# Patient Record
Sex: Female | Born: 1980 | Race: Black or African American | Hispanic: No | Marital: Married | Smoking: Never smoker
Health system: Southern US, Community
[De-identification: ages and names within clinical notes are randomized; demographics above are authoritative.]

## PROBLEM LIST (undated history)

## (undated) DIAGNOSIS — T7840XA Allergy, unspecified, initial encounter: Secondary | ICD-10-CM

## (undated) DIAGNOSIS — L709 Acne, unspecified: Secondary | ICD-10-CM

## (undated) DIAGNOSIS — J45909 Unspecified asthma, uncomplicated: Secondary | ICD-10-CM

## (undated) HISTORY — DX: Acne, unspecified: L70.9

## (undated) HISTORY — DX: Unspecified asthma, uncomplicated: J45.909

## (undated) HISTORY — DX: Allergy, unspecified, initial encounter: T78.40XA

---

## 2019-01-30 ENCOUNTER — Other Ambulatory Visit: Payer: Self-pay | Admitting: Otolaryngology

## 2019-01-30 DIAGNOSIS — E041 Nontoxic single thyroid nodule: Secondary | ICD-10-CM

## 2019-01-31 ENCOUNTER — Ambulatory Visit
Admission: RE | Admit: 2019-01-31 | Discharge: 2019-01-31 | Disposition: A | Discharge status: Home / Self Care | Payer: Self-pay | Source: Ambulatory Visit | Attending: Otolaryngology | Admitting: Otolaryngology

## 2019-01-31 DIAGNOSIS — E041 Nontoxic single thyroid nodule: Secondary | ICD-10-CM

## 2019-03-24 DIAGNOSIS — R09A2 Foreign body sensation, throat: Secondary | ICD-10-CM | POA: Insufficient documentation

## 2019-03-24 DIAGNOSIS — K219 Gastro-esophageal reflux disease without esophagitis: Secondary | ICD-10-CM | POA: Insufficient documentation

## 2019-08-25 DIAGNOSIS — J4520 Mild intermittent asthma, uncomplicated: Secondary | ICD-10-CM | POA: Insufficient documentation

## 2020-09-04 IMAGING — US US THYROID
1 series · 13 of 25 positions shown · non-contrast
Comparison: None.

CLINICAL DATA: Palpable abnormality. Thyroid nodule palpated on
physical examination. Difficulty swallowing.

EXAM:
THYROID ULTRASOUND
TECHNIQUE: Ultrasound examination of the thyroid gland and adjacent soft
tissues was performed.

[Series 1: us thyroid · 0.08mm/px · 13 of 45 slices shown]
[im 1/45]
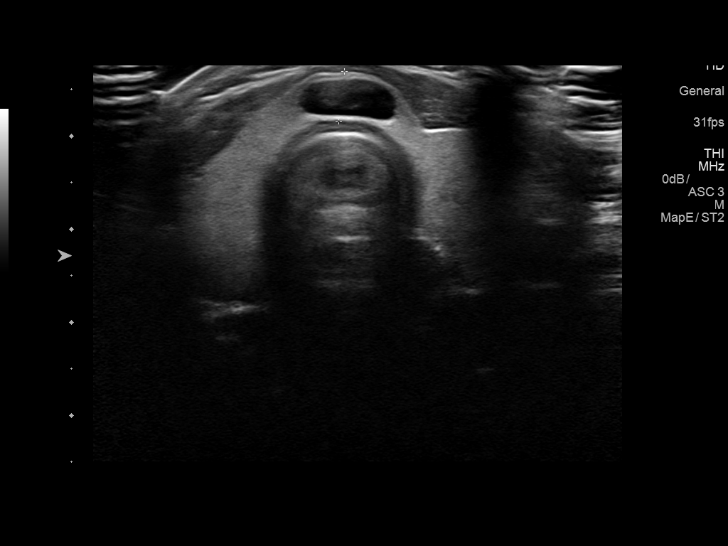
[im 4/45]
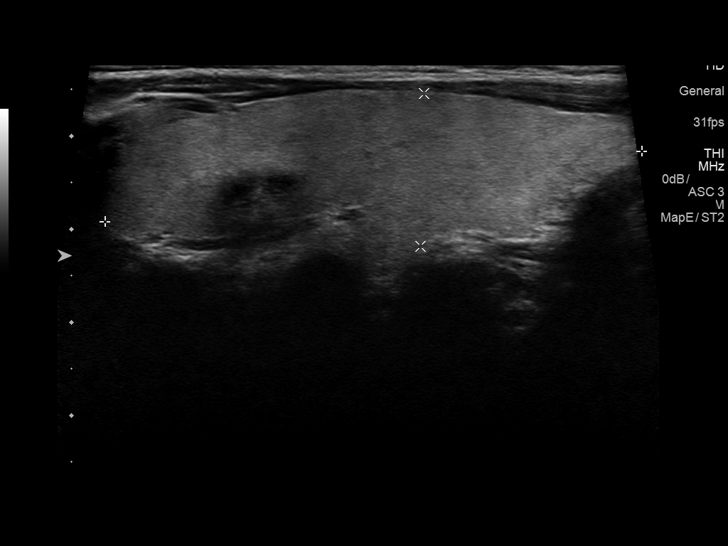
[im 8/45]
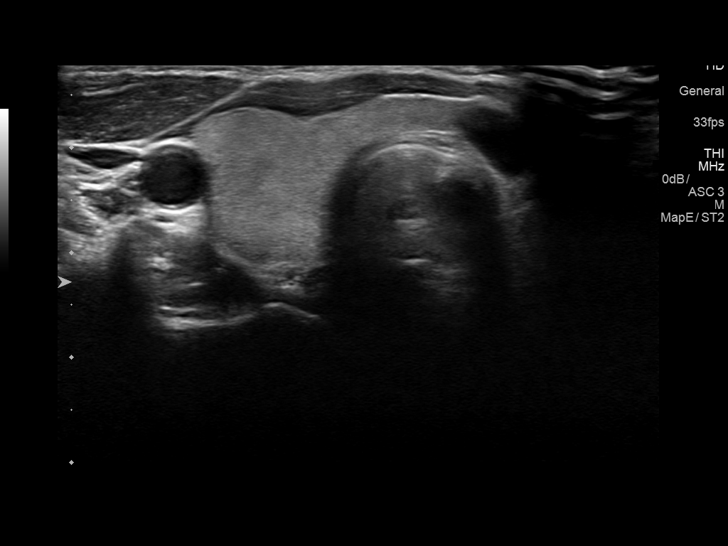
[im 12/45]
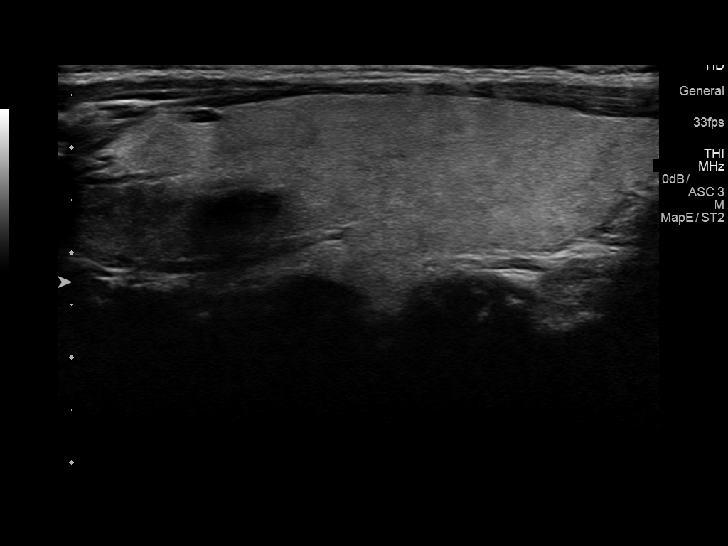
[im 15/45]
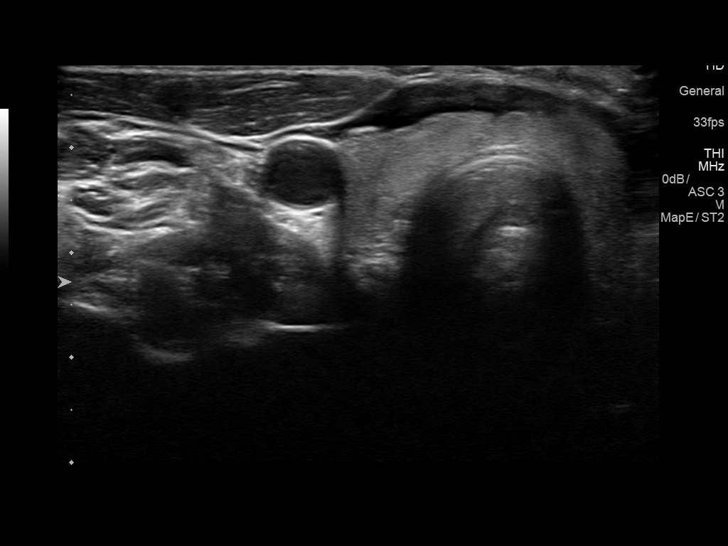
[im 19/45]
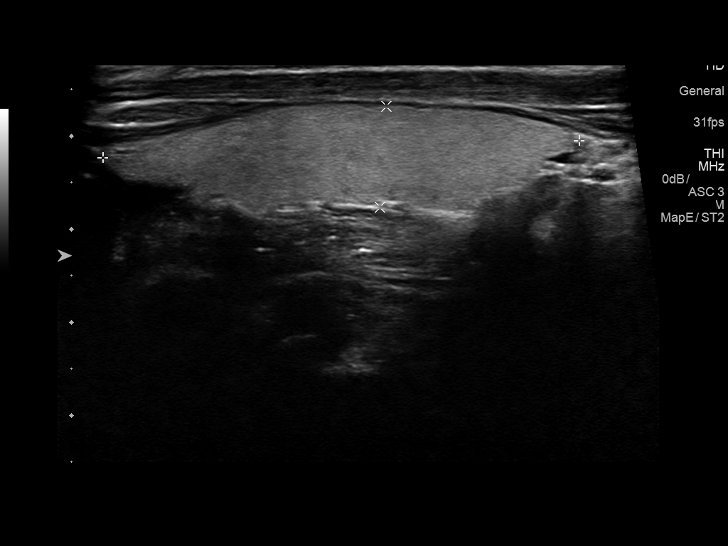
[im 23/45]
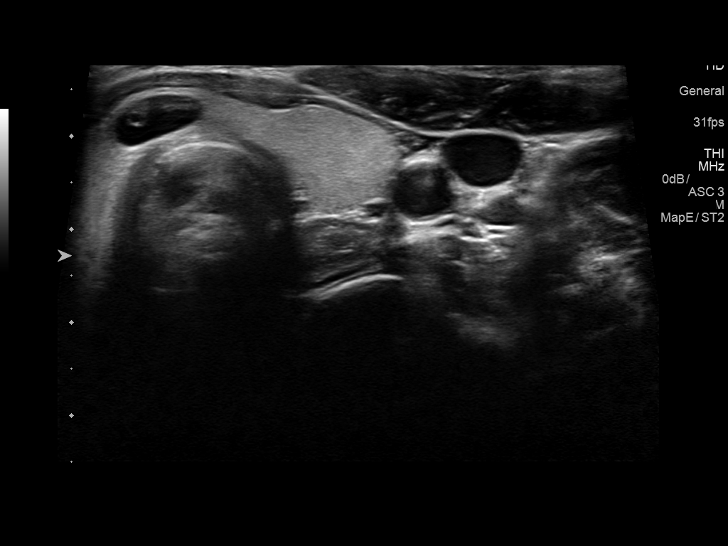
[im 26/45]
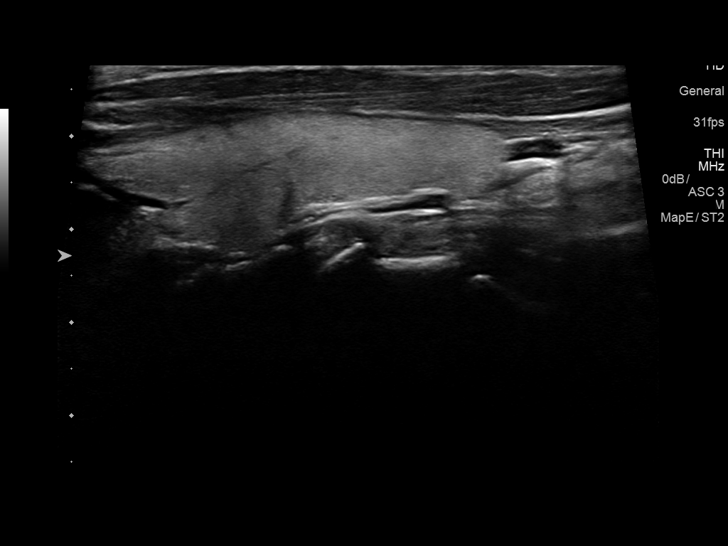
[im 30/45]
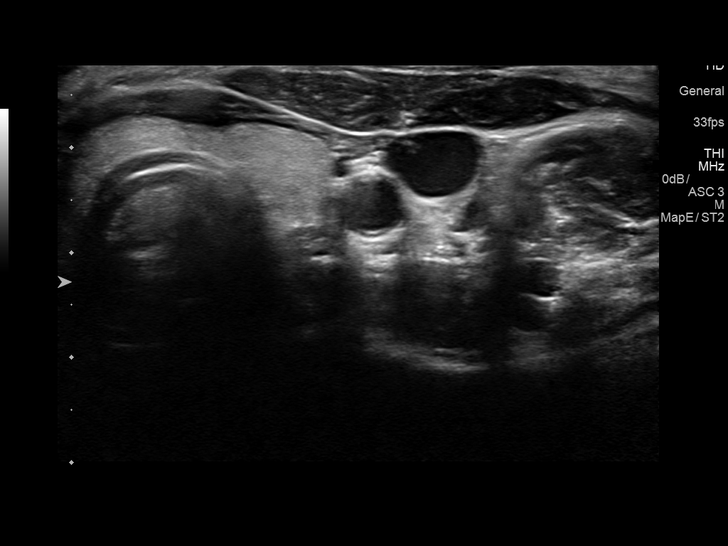
[im 34/45]
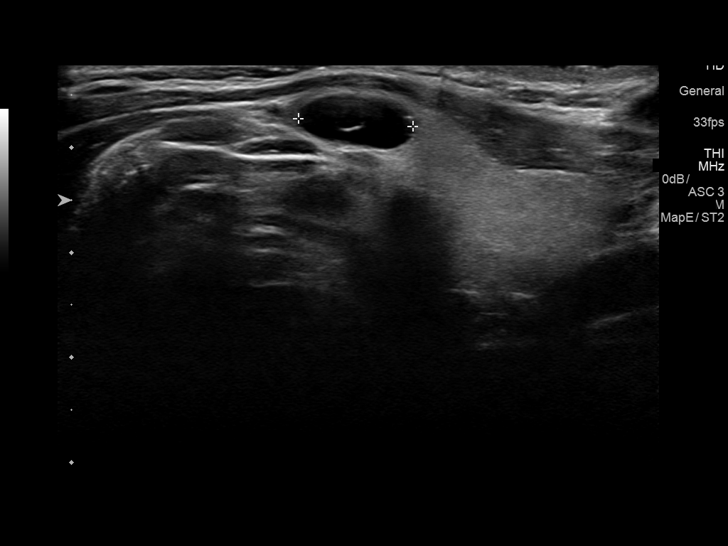
[im 37/45]
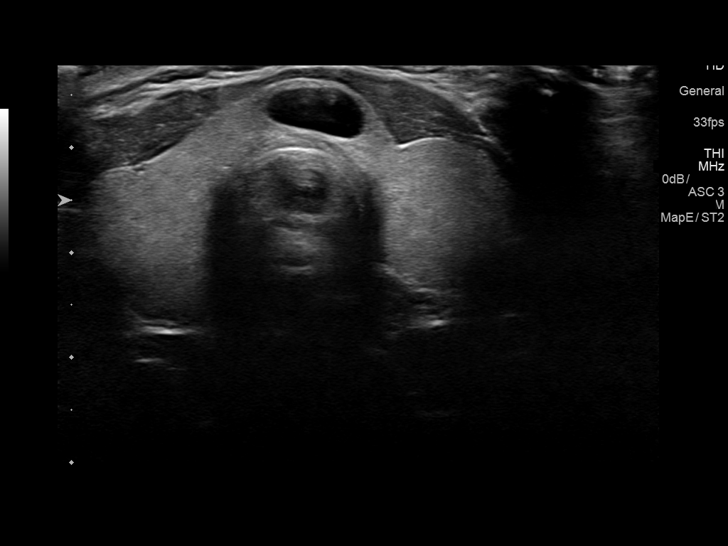
[im 41/45]
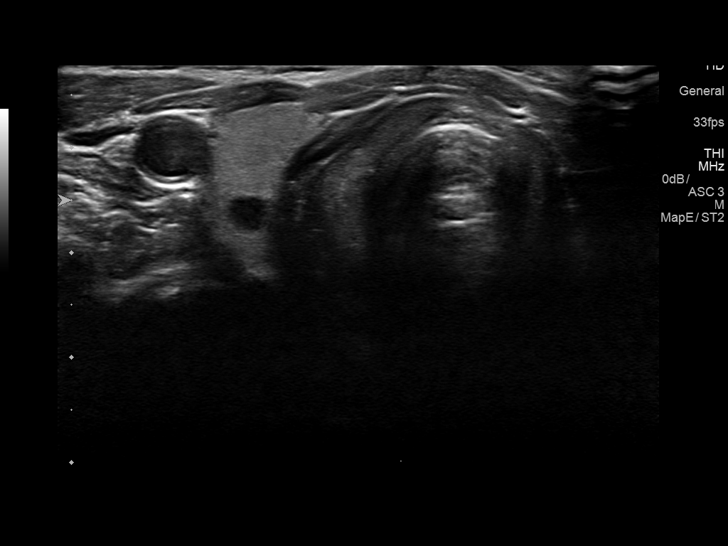
[im 45/45]
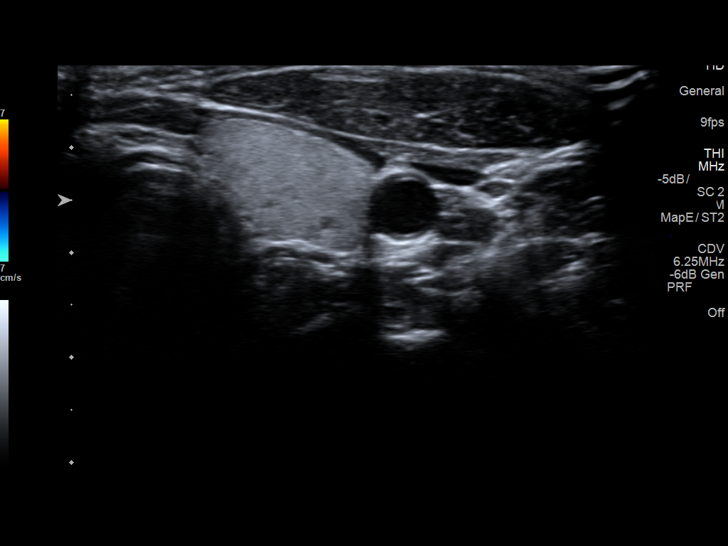

[13 of 25 positions shown; findings below may reference images not displayed]

FINDINGS: Parenchymal Echotexture: Normal

Isthmus: Normal in size measures 0.5 cm in diameter

Right lobe: Normal in size measuring 5.8 x 1.6 x 1.4 cm

Left lobe: Normal in size measuring 5.1 x 1.1 x 1.4 cm

_________________________________________________________

Estimated total number of nodules >/= 1 cm: 1

Number of spongiform nodules >/=  2 cm not described below (TR1): 0

Number of mixed cystic and solid nodules >/= 1.5 cm not described
below (TR2): 0

_________________________________________________________

There is an approximately 1.1 x 0.9 x 0.4 cm anechoic cyst within
the thyroid isthmus which is noted to contain a thin internal
echogenic septation, however does not meet imaging criteria to
recommend percutaneous sampling or continued dedicated follow-up

There is a approximately 0.8 x 0.4 x 0.4 cm anechoic cyst within the
superior/mid aspect the right lobe of the thyroid (labeled 2), which
is noted to contain internal echogenic foci with ring down artifact
compatible with benign colloid. This benign colloid containing cyst
does not meet imaging criteria to recommend percutaneous sampling or
continued dedicated follow-up.
IMPRESSION: 1. Punctate (sub 1.1 cm) thyroid cysts, neither of which meet
imaging criteria to recommend percutaneous sampling or continued
dedicated follow-up.
2. Otherwise, normal thyroid ultrasound.

The above is in keeping with the ACR TI-RADS recommendations - [HOSPITAL] 5659;[DATE].

## 2023-04-12 ENCOUNTER — Ambulatory Visit: Payer: Commercial Managed Care - PPO | Admitting: Dermatology

## 2023-04-12 ENCOUNTER — Encounter: Payer: Self-pay | Admitting: Dermatology

## 2023-04-12 VITALS — BP 97/66

## 2023-04-12 DIAGNOSIS — L700 Acne vulgaris: Secondary | ICD-10-CM | POA: Diagnosis not present

## 2023-04-12 DIAGNOSIS — L821 Other seborrheic keratosis: Secondary | ICD-10-CM | POA: Diagnosis not present

## 2023-04-12 DIAGNOSIS — L810 Postinflammatory hyperpigmentation: Secondary | ICD-10-CM | POA: Diagnosis not present

## 2023-04-12 MED ORDER — HYDROQUINONE 4 % EX CREA: TOPICAL_CREAM | Freq: Every evening | CUTANEOUS | 2 refills | Status: DC

## 2023-04-12 MED ORDER — CLINDAMYCIN PHOSPHATE 1 % EX SWAB: 1.0000 | Freq: Every morning | CUTANEOUS | 2 refills | Status: AC

## 2023-04-12 MED ORDER — TRETINOIN 0.025 % EX CREA: TOPICAL_CREAM | Freq: Every day | CUTANEOUS | 2 refills | Status: DC

## 2023-04-12 NOTE — Patient Instructions (Addendum)
She wants to schedule a cosmetic procedure for removal DPN'S. Quoted $300. She will need to come in 1 hour early. It should be in a 12:30 time slot for 30 minutes.  Counseling I counseled the patient regarding the following: Skin care: I discussed with the patient the impo/rtance of using cleansers, moisturizers and cosmetics that are non-comedogenic. Expectations: The patient is aware that it may take up to 2-3 months to see a 60-80% improvement of acne. Contact office if: Acne worsens or fails to improve despite months of treatment; patient develops new scars, significantly more nodules or cysts. I recommended the following:   Medication Counseling Retinoid Counseling: Patient advised to apply a pea-sized amount only at bedtime and wait 30 minutes after washing their face before applying. If too drying, patient may add a non-comedogenic moisturizer. The patient verbalized understanding of the proper use and possible adverse effects of retinoids. All of the patient's questions and concerns were addressed.                                                                Daily Skincare Regimen: Patient Handout  Morning Routine:  Cleanse: Start your day by washing your face with a gentle cleanser. Choose a product suitable for your skin type, such as CeraVe, Neutrogena, Cetaphil, or LaRoche-Posay. Gently massage the cleanser onto damp skin, then rinse thoroughly with lukewarm water and pat dry with a clean towel.  Apply Medication: Apply prescription medication Clindamycin swabs every morning   Moisturize: Finish your morning routine by applying a moisturizer to your face and neck. Opt for a moisturizer that has SPF included, is suitable for your skin type and provides hydration without clogging pores. Consider brands like CeraVe, Neutrogena, Cetaphil, or LaRoche-Posay for effective hydration and skin barrier support.  Evening Routine:  Cleanse: Before bed, cleanse your face again with a gentle  cleanser to remove makeup, dirt, and impurities accumulated throughout the day. Use the same cleanser as in the morning and follow the same steps for cleansing.  Apply Medication: Ensure that your skin is completely dry before applying any topical treatments to maximize their efficacy.  Apply prescription medication Tretinoin cream 3 x weekly. Hydroquione cream on dark areas on nights not using Tretinoin  Moisturize: After applying any medications, moisturize your skin to seal in hydration and support skin barrier function. Choose a moisturizer suitable for nighttime use that helps replenish moisture overnight. Look for products from trusted brands like CeraVe, Neutrogena, Cetaphil, or LaRoche-Posay for optimal results.   Additional Tips:  Sun Protection: During the daytime, it is essential to apply a broad-spectrum sunscreen with an SPF of 30 or higher to protect your skin from harmful UV rays. Apply sunscreen as the last step of your morning skincare routine and reapply throughout the day as needed, especially if you will be spending time outdoors.  Hydration: Drink plenty of water throughout the day to keep your skin hydrated from within.  Healthy Lifestyle: Maintain a balanced diet, get regular exercise, manage stress, and prioritize adequate sleep to support overall skin health.   Following a consistent daily skincare regimen can help promote healthy, radiant skin and minimize the risk of common skin issues. Be patient and consistent with your routine, and remember to listen to your skin's needs  Due to recent changes in healthcare laws, you may see results of your pathology and/or laboratory studies on MyChart before the doctors have had a chance to review them. We understand that in some cases there may be results that are confusing or concerning to you. Please understand that not all results are received at the same time and often the doctors may need to interpret multiple results in order  to provide you with the best plan of care or course of treatment. Therefore, we ask that you please give [place] 2 business days to thoroughly review all your results before contacting the office for clarification. Should we see a critical lab result, you will be contacted sooner.   If You Need Anything After Your Visit  If you have any questions or concerns for your doctor, please call our main line at [phone] If no one answers, please leave a voicemail as directed and we will return your call as soon as possible. Messages left after 4 pm will be answered the following business day.   You may also send [place] a message via MyChart. We typically respond to MyChart messages within 1-2 business days.  For prescription refills, please ask your pharmacy to contact our office. Our fax number is [phone].  If you have an urgent issue when the clinic is closed that cannot wait until the next business day, you can page your doctor at the number below.    Please note that while we do our best to be available for urgent issues outside of office hours, we are not available 24/7.   If you have an urgent issue and are unable to reach [place], you may choose to seek medical care at your doctor's office, retail clinic, urgent care center, or emergency room.  If you have a medical emergency, please immediately call 911 or go to the emergency department. In the event of inclement weather, please call our main line at [phone] for an update on the status of any delays or closures.  Dermatology Medication Tips: Please keep the boxes that topical medications come in in order to help keep track of the instructions about where and how to use these. Pharmacies typically print the medication instructions only on the boxes and not directly on the medication tubes.   If your medication is too expensive, please contact our office at [phone] or send [place] a message through MyChart.   We are unable to tell what your  co-pay for medications will be in advance as this is different depending on your insurance coverage. However, we may be able to find a substitute medication at lower cost or fill out paperwork to get insurance to cover a needed medication.   If a prior authorization is required to get your medication covered by your insurance company, please allow [place] 1-2 business days to complete this process.  Drug prices often vary depending on where the prescription is filled and some pharmacies may offer cheaper prices.  The website www.goodrx.com contains coupons for medications through different pharmacies. The prices here do not account for what the cost may be with help from insurance (it may be cheaper with your insurance), but the website can give you the price if you did not use any insurance.  - You can print the associated coupon and take it with your prescription to the pharmacy.  - You may also stop by our office during regular business hours and pick up a GoodRx coupon card.  - If you need  your prescription sent electronically to a different pharmacy, notify our office through [facility] or by phone at [phone]

## 2023-04-12 NOTE — Progress Notes (Signed)
   New Patient Visit   Subjective  Sheri Grant is a 42 y.o. female who presents for the following: Acne on her face x 30 + years with bumps and pustules. She says she is getting hyperpigmentation after flares ups. Acne meds currently are [facility] Tretinon .005/Clindamycin 1% Azelaic compounded cream which she uses nightly. It has improved since she started it 2 years. She is using Kiehl's Vit C, SA dark spot corrector currently. She has been using it for one month without much improvement.    The following portions of the chart were reviewed this encounter and updated as appropriate: medications, allergies, medical history  Review of Systems:  No other skin or systemic complaints except as noted in HPI or Assessment and Plan.  Objective  Well appearing patient in no apparent distress; mood and affect are within normal limits.    A focused examination was performed of the following areas: face  Relevant exam findings are noted in the Assessment and Plan.    Assessment & Plan   ACNE VULGARIS Exam: Open comedones and inflammatory papules  Not at goal  Treatment Plan: Clindamycin swabs every morning Tretinoin 0.025 % cream 3 nights weekly. Apply a moisturizer afterwards.   Hyperpigmentation Exam: brown macules in the areas of prior acne lesions   Patient Education I counseled the patient regarding the following: Skin care: Recommend minimizing sun exposure, wearing sunscreen and protective clothing. / Expectations: Post Inflammatory pigmentary change is lighter or darker discoloration than surrounding skin resulting from trauma or rashes. Areas tend to normalize over time, but can take months to years.   Treatment Plan: -topical retinoid started today will help lift some excess pigment -once skin is acclimated to retinoid we will add in a separate topical lightening agent -recommended broad spectrum SPF30 every day, hats and sun avoidence.    Hydroquinone to dark areas  only. To be applied on nights when not using Tretinoin.   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 Expectations: Dermatosis Papulosa Sheri Grant are benign growths. No treatment is necessary. Plan: Cosmetic Quote The patient received the following cosmetic quote   Other Procedure(Skin Tags): Quote for cosmetic removal:   $300 to remove on 2 areas face and neck .    Return in about 2 months (around 06/12/2023) for Acne Follow Up.  Jaclynn Guarneri, CMA, am acting as scribe for Langston Reusing, MD.   Documentation: I have reviewed the above documentation for accuracy and completeness, and I agree with the above.  Langston Reusing, MD   Acne

## 2023-04-26 ENCOUNTER — Ambulatory Visit (INDEPENDENT_AMBULATORY_CARE_PROVIDER_SITE_OTHER): Payer: Self-pay | Admitting: Dermatology

## 2023-04-26 DIAGNOSIS — L821 Other seborrheic keratosis: Secondary | ICD-10-CM

## 2023-04-26 NOTE — Progress Notes (Signed)
   Follow-Up Visit   Subjective  Sheri Grant is a 42 y.o. female who presents for the following: Cosmetic Procedure of Dermatosis papulosa nigra   The following portions of the chart were reviewed this encounter and updated as appropriate: medications, allergies, medical history  Review of Systems:  No other skin or systemic complaints except as noted in HPI or Assessment and Plan.  Objective  Well appearing patient in no apparent distress; mood and affect are within normal limits.  A focused examination was performed of the following areas: Face, Neck, and Left axillae   Relevant exam findings are noted in the Assessment and Plan.    Assessment & Plan   Dermatosis papulosa nigra  Destruction Procedure Note Destruction method: electrodesiccation  Informed consent: discussed and consent obtained   Lesion destroyed using hyfrecator: Yes   Outcome: patient tolerated procedure well with no complications   Post-procedure details: wound care instructions given   Locations: Face, Neck, and Left Axillae # of Lesions Treated: Greater than 30  Prior to procedure, discussed risks of scab formation, small wound, skin dyspigmentation, or rare scar following electrodesciccation. Recommend Vaseline ointment or LaRochePosay Cicaplast Balm to treated areas while healing.  Patient is still using Tretinoin as prescribed. Counseled on stopping Tretinoin for three weeks. Continue wearing sunscreen  Return if symptoms worsen or fail to improve.    Documentation: I have reviewed the above documentation for accuracy and completeness, and I agree with the above.  Langston Reusing, DO   I, Germaine Pomfret, CMA, am acting as scribe for [employer], DO.

## 2023-04-26 NOTE — Patient Instructions (Addendum)
Due to recent changes in healthcare laws, you may see results of your pathology and/or laboratory studies on MyChart before the doctors have had a chance to review them. We understand that in some cases there may be results that are confusing or concerning to you. Please understand that not all results are received at the same time and often the doctors may need to interpret multiple results in order to provide you with the best plan of care or course of treatment. Therefore, we ask that you please give us 2 business days to thoroughly review all your results before contacting the office for clarification. Should we see a critical lab result, you will be contacted sooner.   If You Need Anything After Your Visit  If you have any questions or concerns for your doctor, please call our main line at [phone] If no one answers, please leave a voicemail as directed and we will return your call as soon as possible. Messages left after 4 pm will be answered the following business day.   You may also send us a message via MyChart. We typically respond to MyChart messages within 1-2 business days.  For prescription refills, please ask your pharmacy to contact our office. Our fax number is [phone].  If you have an urgent issue when the clinic is closed that cannot wait until the next business day, you can page your doctor at the number below.    Please note that while we do our best to be available for urgent issues outside of office hours, we are not available 24/7.   If you have an urgent issue and are unable to reach us, you may choose to seek medical care at your doctor's office, retail clinic, urgent care center, or emergency room.  If you have a medical emergency, please immediately call 911 or go to the emergency department. In the event of inclement weather, please call our main line at [phone] for an update on the status of any delays or closures.  Dermatology Medication Tips: Please  keep the boxes that topical medications come in in order to help keep track of the instructions about where and how to use these. Pharmacies typically print the medication instructions only on the boxes and not directly on the medication tubes.   If your medication is too expensive, please contact our office at [phone] or send us a message through MyChart.   We are unable to tell what your co-pay for medications will be in advance as this is different depending on your insurance coverage. However, we may be able to find a substitute medication at lower cost or fill out paperwork to get insurance to cover a needed medication.   If a prior authorization is required to get your medication covered by your insurance company, please allow us 1-2 business days to complete this process.  Drug prices often vary depending on where the prescription is filled and some pharmacies may offer cheaper prices.  The website www.goodrx.com contains coupons for medications through different pharmacies. The prices here do not account for what the cost may be with help from insurance (it may be cheaper with your insurance), but the website can give you the price if you did not use any insurance.  - You can print the associated coupon and take it with your prescription to the pharmacy.  - You may also stop by our office during regular business hours and pick up a GoodRx coupon card.  - If you need your   prescription sent electronically to a different pharmacy, notify our office through [place] MyChart or by phone at [phone]     

## 2023-04-29 ENCOUNTER — Encounter: Payer: Self-pay | Admitting: Dermatology

## 2023-05-01 ENCOUNTER — Encounter: Payer: Self-pay | Admitting: Dermatology

## 2023-05-01 ENCOUNTER — Other Ambulatory Visit: Payer: Self-pay | Admitting: Dermatology

## 2023-05-01 MED ORDER — HYDROCORTISONE 2.5 % EX CREA: TOPICAL_CREAM | Freq: Two times a day (BID) | CUTANEOUS | 11 refills | Status: AC

## 2023-05-01 NOTE — Progress Notes (Signed)
Sending rx for hydrocortisone 2.5% cream to treat contact dermatitis on face s/p DPN removal.

## 2023-06-12 ENCOUNTER — Encounter: Payer: Self-pay | Admitting: Dermatology

## 2023-06-12 ENCOUNTER — Ambulatory Visit (INDEPENDENT_AMBULATORY_CARE_PROVIDER_SITE_OTHER): Payer: Commercial Managed Care - PPO | Admitting: Dermatology

## 2023-06-12 DIAGNOSIS — H1045 Other chronic allergic conjunctivitis: Secondary | ICD-10-CM | POA: Insufficient documentation

## 2023-06-12 DIAGNOSIS — J45909 Unspecified asthma, uncomplicated: Secondary | ICD-10-CM | POA: Insufficient documentation

## 2023-06-12 DIAGNOSIS — J3081 Allergic rhinitis due to animal (cat) (dog) hair and dander: Secondary | ICD-10-CM | POA: Insufficient documentation

## 2023-06-12 DIAGNOSIS — J301 Allergic rhinitis due to pollen: Secondary | ICD-10-CM | POA: Insufficient documentation

## 2023-06-12 DIAGNOSIS — J4530 Mild persistent asthma, uncomplicated: Secondary | ICD-10-CM | POA: Insufficient documentation

## 2023-06-12 DIAGNOSIS — L810 Postinflammatory hyperpigmentation: Secondary | ICD-10-CM

## 2023-06-12 DIAGNOSIS — L700 Acne vulgaris: Secondary | ICD-10-CM | POA: Diagnosis not present

## 2023-06-12 DIAGNOSIS — J309 Allergic rhinitis, unspecified: Secondary | ICD-10-CM | POA: Insufficient documentation

## 2023-06-12 DIAGNOSIS — K2100 Gastro-esophageal reflux disease with esophagitis, without bleeding: Secondary | ICD-10-CM | POA: Insufficient documentation

## 2023-06-12 MED ORDER — TRETINOIN 0.05 % EX CREA
TOPICAL_CREAM | Freq: Every day | CUTANEOUS | 3 refills | Status: AC
Start: 2023-06-12 — End: 2024-06-11

## 2023-06-12 NOTE — Progress Notes (Unsigned)
   Follow-Up Visit   Subjective  Sheri Grant is a 42 y.o. female who presents for the following: Acne and PIH  Patient present today for follow up visit for acne. Patient was last evaluated on 04/12/23. Patient reports sxs are not at goal. Patient reports no medication changes. She is currently applying the Tretinoin M-W-F nightly and alternating with Hydroquinone when not using the Tretinonin.  The following portions of the chart were reviewed this encounter and updated as appropriate: medications, allergies, medical history  Review of Systems:  No other skin or systemic complaints except as noted in HPI or Assessment and Plan.  Objective  Well appearing patient in no apparent distress; mood and affect are within normal limits.  A focused examination was performed of the following areas: Face  Relevant exam findings are noted in the Assessment and Plan.         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 Recommend daily sun protection including sunscreen SPF 30+ to sun-exposed areas. - Recommend treating any itchy or red areas on the skin quickly to prevent new areas of PIH. Treating with prescription medicines such as hydroquinone may help fade dark spots faster.    Treatment Plan: - Recommend Iztree Sunscreen Daily. Educated on using Mineral sunscreen, provided with Neutrogena Mineral Sunscreen (Try testing on a smaller area of skin to watch for reaction), Also provided with Cerave Mineral Sunscreen -STOP Hydroquinone 4% - We will send  [facility] prescription, which includes Hydroquinone 12%, Kojic Acid, and Niacinamide. Apply this formula to the full face at night, not just on spots. Instructed to use on the nights you are using  not using tretinoin   Acne vulgaris  Related Medications tretinoin (RETIN-A) 0.05 % cream Apply topically at bedtime. START USING 2 NIGHTS WEEKLY, IF TOLERATED WELL INCREASE TO 3 NIGHTS WEEKLY   Return in about 3 months (around 09/12/2023) for Acne & PIH F/U.    Documentation: I have reviewed the above documentation for accuracy and completeness, and I agree with the above.  Stasia Cavalier, am acting as scribe for Langston Reusing, DO.  Langston Reusing, DO

## 2023-06-12 NOTE — Patient Instructions (Addendum)
Thank you for visiting [place] today, and for your dedication to improving your skin health. It was great to see the progress you've made, and I appreciate your commitment to following the treatment plan. Here's a summary of the key instructions we discussed:  - Continue using Tretinoin: Increase from 0.025% to 0.05%, starting with two nights a week for the first two weeks. If well-tolerated, increase to three nights a week. - Sunscreen Recommendations:   - Use ISTREE as a daily base for broad-spectrum protection.   - Apply Neutrogena's mineral sunscreen for additional protection. Test a small area first due to your previous reactions. Samples will be provided.   - Consider CeraVe's mineral sunscreen as an alternative; samples will also be provided. - Hyperpigmentation Treatment:   - Discontinue Hydroquinone 4%.   - Begin using a compounded formula from [employer], which includes Hydroquinone 12%, Kojic Acid, and Niacinamide. Apply this formula to the full face at night, not just on spots. - Follow-up Appointment: Schedule for early October to assess progress and possibly adjust the treatment plan.  Please ensure to apply sunscreens as directed, especially when spending extended periods outdoors, to protect your skin from further damage. If you have any questions or if there's anything else you need, feel free to reach out through MyChart or call our office.   Due to recent changes in healthcare laws, you may see results of your pathology and/or laboratory studies on MyChart before the doctors have had a chance to review them. We understand that in some cases there may be results that are confusing or concerning to you. Please understand that not all results are received at the same time and often the doctors may need to interpret multiple results in order to provide you with the best plan of care or course of treatment. Therefore, we ask that you please give [place] 2 business days to thoroughly review  all your results before contacting the office for clarification. Should we see a critical lab result, you will be contacted sooner.   If You Need Anything After Your Visit  If you have any questions or concerns for your doctor, please call our main line at [phone] If no one answers, please leave a voicemail as directed and we will return your call as soon as possible. Messages left after 4 pm will be answered the following business day.   You may also send [place] a message via MyChart. We typically respond to MyChart messages within 1-2 business days.  For prescription refills, please ask your pharmacy to contact our office. Our fax number is [phone].  If you have an urgent issue when the clinic is closed that cannot wait until the next business day, you can page your doctor at the number below.    Please note that while we do our best to be available for urgent issues outside of office hours, we are not available 24/7.   If you have an urgent issue and are unable to reach [place], you may choose to seek medical care at your doctor's office, retail clinic, urgent care center, or emergency room.  If you have a medical emergency, please immediately call 911 or go to the emergency department. In the event of inclement weather, please call our main line at [phone] for an update on the status of any delays or closures.  Dermatology Medication Tips: Please keep the boxes that topical medications come in in order to help keep track of the instructions about where and how  to use these. Pharmacies typically print the medication instructions only on the boxes and not directly on the medication tubes.   If your medication is too expensive, please contact our office at [phone] or send [place] a message through MyChart.   We are unable to tell what your co-pay for medications will be in advance as this is different depending on your insurance coverage. However, we may be able to find a substitute  medication at lower cost or fill out paperwork to get insurance to cover a needed medication.   If a prior authorization is required to get your medication covered by your insurance company, please allow [place] 1-2 business days to complete this process.  Drug prices often vary depending on where the prescription is filled and some pharmacies may offer cheaper prices.  The website www.goodrx.com contains coupons for medications through different pharmacies. The prices here do not account for what the cost may be with help from insurance (it may be cheaper with your insurance), but the website can give you the price if you did not use any insurance.  - You can print the associated coupon and take it with your prescription to the pharmacy.  - You may also stop by our office during regular business hours and pick up a GoodRx coupon card.  - If you need your prescription sent electronically to a different pharmacy, notify our office through [facility] or by phone at [phone]

## 2023-07-16 ENCOUNTER — Ambulatory Visit: Payer: 59 | Admitting: Dermatology

## 2023-09-12 ENCOUNTER — Ambulatory Visit: Payer: 59 | Admitting: Dermatology

## 2023-10-02 ENCOUNTER — Encounter: Payer: Self-pay | Admitting: Dermatology

## 2023-10-02 ENCOUNTER — Ambulatory Visit (INDEPENDENT_AMBULATORY_CARE_PROVIDER_SITE_OTHER): Payer: Commercial Managed Care - PPO | Admitting: Dermatology

## 2023-10-02 DIAGNOSIS — L700 Acne vulgaris: Secondary | ICD-10-CM

## 2023-10-02 DIAGNOSIS — L858 Other specified epidermal thickening: Secondary | ICD-10-CM | POA: Diagnosis not present

## 2023-10-02 DIAGNOSIS — L810 Postinflammatory hyperpigmentation: Secondary | ICD-10-CM

## 2023-10-02 DIAGNOSIS — L819 Disorder of pigmentation, unspecified: Secondary | ICD-10-CM | POA: Diagnosis not present

## 2023-10-02 NOTE — Patient Instructions (Addendum)
Hello Sheri Grant,  Thank you for visiting [place] today. Your dedication to improving your skin health is greatly appreciated. Below is a summary of the key instructions from today's consultation:  - Tretinoin Use: Continue applying tretinoin three nights a week (Monday, Wednesday, Friday).  - Hydroquinone Discontinuation: Stop using hydroquinone on your cheeks and forehead.  - Spot Treatment: Apply hydroquinone to the hyperpigmented macule on your chin every night for the next three months.  - Hormonal Acne Management: Use Cabtrio as a spot treatment for hormonal acne flares on your chin at nighttime as needed.  - Morning Routine:   - Wash face.   - Apply Vichy Vitamin C serum.   - Follow with moisturizer.   - Apply INS Tree sunscreen.  - Nighttime Routine:   - Wash face.   - Apply tretinoin (three nights a week).   - Follow with moisturizer.   - Spot treat with hydroquinone and Cabtrio as needed.  - Keratosis Pilaris Treatment: For keratosis pilaris on upper arms, shoulders, and back, mix tretinoin with Cetaphil moisturizer and apply as discussed.  - Exfoliation Transition: Switch from salicylic acid to glycolic acid for exfoliation. Use The Ordinary glycolic acid toner once a week or every other week based on skin dryness.  - Amlactin Discontinuation: Stop using Amlactin lotion.  - Follow-Up: We will see you again in three months to assess progress and possibly adjust your treatment.  Please do not hesitate to reach out if you have any questions or concerns about your treatment plan.  Best regards,  Dr. Langston Reusing [facility]         Important Information  Due to recent changes in healthcare laws, you may see results of your pathology and/or laboratory studies on MyChart before the doctors have had a chance to review them. We understand that in some cases there may be results that are confusing or concerning to you. Please understand that not all results are received at  the same time and often the doctors may need to interpret multiple results in order to provide you with the best plan of care or course of treatment. Therefore, we ask that you please give [place] 2 business days to thoroughly review all your results before contacting the office for clarification. Should we see a critical lab result, you will be contacted sooner.   If You Need Anything After Your Visit  If you have any questions or concerns for your doctor, please call our main line at [phone] If no one answers, please leave a voicemail as directed and we will return your call as soon as possible. Messages left after 4 pm will be answered the following business day.   You may also send [place] a message via MyChart. We typically respond to MyChart messages within 1-2 business days.  For prescription refills, please ask your pharmacy to contact our office. Our fax number is [phone].  If you have an urgent issue when the clinic is closed that cannot wait until the next business day, you can page your doctor at the number below.    Please note that while we do our best to be available for urgent issues outside of office hours, we are not available 24/7.   If you have an urgent issue and are unable to reach [place], you may choose to seek medical care at your doctor's office, retail clinic, urgent care center, or emergency room.  If you have a medical emergency, please immediately call 911 or go to the  emergency department. In the event of inclement weather, please call our main line at [phone] for an update on the status of any delays or closures.  Dermatology Medication Tips: Please keep the boxes that topical medications come in in order to help keep track of the instructions about where and how to use these. Pharmacies typically print the medication instructions only on the boxes and not directly on the medication tubes.   If your medication is too expensive, please contact our office at  [phone] or send [place] a message through MyChart.   We are unable to tell what your co-pay for medications will be in advance as this is different depending on your insurance coverage. However, we may be able to find a substitute medication at lower cost or fill out paperwork to get insurance to cover a needed medication.   If a prior authorization is required to get your medication covered by your insurance company, please allow [place] 1-2 business days to complete this process.  Drug prices often vary depending on where the prescription is filled and some pharmacies may offer cheaper prices.  The website www.goodrx.com contains coupons for medications through different pharmacies. The prices here do not account for what the cost may be with help from insurance (it may be cheaper with your insurance), but the website can give you the price if you did not use any insurance.  - You can print the associated coupon and take it with your prescription to the pharmacy.  - You may also stop by our office during regular business hours and pick up a GoodRx coupon card.  - If you need your prescription sent electronically to a different pharmacy, notify our office through [facility] or by phone at [phone]

## 2023-10-02 NOTE — Progress Notes (Signed)
   Follow-Up Visit   Subjective  Sheri Grant is a 42 y.o. female who presents for the following: acne  Patient present today for follow up visit for follow up. Patient was last evaluated on 06/12/23. Pt has been using Tretinoin M-W-F nightly and alternating with Hydroquinone when not using the Tretinoin. Patient reports sxs are better. Patient denies medication changes.  The following portions of the chart were reviewed this encounter and updated as appropriate: medications, allergies, medical history  Review of Systems:  No other skin or systemic complaints except as noted in HPI or Assessment and Plan.  Objective  Well appearing patient in no apparent distress; mood and affect are within normal limits.   A focused examination was performed of the following areas: face   Relevant exam findings are noted in the Assessment and Plan.                 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 One hyperpigmented macule remains on the chin. - Plan: Spot treat the chin with hydroquinone every night for the next three months.  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 - Observe. - If desired, patient can use an emollient (moisturizer) containing ammonium lactate (AmLactin), urea or salicylic acid once a day to smooth the area  Recommend starting moisturizer with exfoliant (Urea, Salicylic acid, or Lactic acid) one to two times daily to help smooth rough and bumpy skin.  OTC options include Cetaphil Rough and Bumpy  lotion (Urea), Eucerin Roughness Relief lotion or spot treatment cream (Urea), CeraVe SA lotion/cream for Rough and Bumpy skin (Sal Acid), Gold Bond Rough and Bumpy cream (Sal Acid), and AmLactin 12% lotion/cream (Lactic Acid).  If applying in morning, also apply sunscreen to sun-exposed areas, since these exfoliating moisturizers can increase sensitivity to sun.     Return in about 3 months (around 01/02/2024) for acne & KP.   Documentation: I have reviewed the above documentation for accuracy and completeness, and I agree with the above.   I, Shirron Marcha Solders, CMA, am acting as scribe for [employer], DO.   Langston Reusing, DO

## 2024-01-02 ENCOUNTER — Ambulatory Visit: Payer: 59 | Admitting: Dermatology

## 2024-03-06 ENCOUNTER — Encounter: Payer: Self-pay | Admitting: Dermatology

## 2024-03-06 ENCOUNTER — Ambulatory Visit: Payer: 59 | Admitting: Dermatology

## 2024-03-06 VITALS — BP 102/63

## 2024-03-06 DIAGNOSIS — L810 Postinflammatory hyperpigmentation: Secondary | ICD-10-CM

## 2024-03-06 DIAGNOSIS — L709 Acne, unspecified: Secondary | ICD-10-CM | POA: Diagnosis not present

## 2024-03-06 MED ORDER — SPIRONOLACTONE 50 MG PO TABS
50.0000 mg | ORAL_TABLET | Freq: Every day | ORAL | 2 refills | Status: AC
Start: 2024-03-06 — End: ?

## 2024-03-06 NOTE — Patient Instructions (Addendum)
 Hello Sheri Grant,  Thank you for visiting today. Here is a summary of the key instructions:  - Medications:   - Start taking spironolactone 50 mg once a day   - Continue using tretinoin 0.05% a couple times a week at night   - Use Cabtreo as a spot treatment for acne   - Use Vichy Vitamin C all over in the morning  - New Treatment:   - Use Eucerin Radiant Tone for hyperpigmentation:     - Spot treat for the first month     - Then use all over, morning and night  - Sun Protection:   - Continue using SPF 30 sunscreen daily   - For stronger protection, consider Neutrogena mineral sunscreens SPF 50  - Diet:   - Avoid eating high potassium foods like bananas and coconut water every day   - Occasional consumption is fine  - Follow-up:   - Return for a follow-up appointment in 4 months  Please reach out if you have any questions or concerns.  Warm regards,  Dr. Langston Reusing [facility]         Important Information  Due to recent changes in healthcare laws, you may see results of your pathology and/or laboratory studies on MyChart before the doctors have had a chance to review them. We understand that in some cases there may be results that are confusing or concerning to you. Please understand that not all results are received at the same time and often the doctors may need to interpret multiple results in order to provide you with the best plan of care or course of treatment. Therefore, we ask that you please give [place] 2 business days to thoroughly review all your results before contacting the office for clarification. Should we see a critical lab result, you will be contacted sooner.   If You Need Anything After Your Visit  If you have any questions or concerns for your doctor, please call our main line at [phone] If no one answers, please leave a voicemail as directed and we will return your call as soon as possible. Messages left after 4 pm will be answered the  following business day.   You may also send [place] a message via MyChart. We typically respond to MyChart messages within 1-2 business days.  For prescription refills, please ask your pharmacy to contact our office. Our fax number is [phone].  If you have an urgent issue when the clinic is closed that cannot wait until the next business day, you can page your doctor at the number below.    Please note that while we do our best to be available for urgent issues outside of office hours, we are not available 24/7.   If you have an urgent issue and are unable to reach [place], you may choose to seek medical care at your doctor's office, retail clinic, urgent care center, or emergency room.  If you have a medical emergency, please immediately call 911 or go to the emergency department. In the event of inclement weather, please call our main line at [phone] for an update on the status of any delays or closures.  Dermatology Medication Tips: Please keep the boxes that topical medications come in in order to help keep track of the instructions about where and how to use these. Pharmacies typically print the medication instructions only on the boxes and not directly on the medication tubes.   If your medication is too expensive, please contact our office  at [phone] or send [place] a message through MyChart.   We are unable to tell what your co-pay for medications will be in advance as this is different depending on your insurance coverage. However, we may be able to find a substitute medication at lower cost or fill out paperwork to get insurance to cover a needed medication.   If a prior authorization is required to get your medication covered by your insurance company, please allow [place] 1-2 business days to complete this process.  Drug prices often vary depending on where the prescription is filled and some pharmacies may offer cheaper prices.  The website www.goodrx.com contains coupons for  medications through different pharmacies. The prices here do not account for what the cost may be with help from insurance (it may be cheaper with your insurance), but the website can give you the price if you did not use any insurance.  - You can print the associated coupon and take it with your prescription to the pharmacy.  - You may also stop by our office during regular business hours and pick up a GoodRx coupon card.  - If you need your prescription sent electronically to a different pharmacy, notify our office through [facility] or by phone at [phone]

## 2024-03-06 NOTE — Progress Notes (Signed)
   Follow-Up Visit   Subjective  Sheri Grant is a 43 y.o. female who presents for the following: Acne - KP  Patient present today for follow up visit. Patient was last evaluated on 10/02/23. At this visit patient was prescribed Tretinoin & Cabtreo . Patient reports sxs are better in regard to the KP on her arms. She stated that she does not see much improvement in the face. Patient denies medication changes.  The following portions of the chart were reviewed this encounter and updated as appropriate: medications, allergies, medical history  Review of Systems:  No other skin or systemic complaints except as noted in HPI or Assessment and Plan.  Objective  Well appearing patient in no apparent distress; mood and affect are within normal limits.   A focused examination was performed of the following areas: face, arms & back   Relevant exam findings are noted in the Assessment and Plan.                   Assessment & Plan   1. Hormonal Acne - Assessment: Patient presents with persistent hormonal acne flares despite good overall skin condition. Current topical regimen includes Vichy Vitamin C, tretinoin 0.05% a couple times a week, and Cabtreo as a spot treatment. These treatments have improved overall skin appearance but are not effectively preventing or treating deeper/tender  hormonal acne lesions.This presentation is consistent with perimenopausal hormonal changes, leading to increased sensitivity of sebaceous glands to hormonal fluctuations.  - Plan:    Initiate spironolactone 50 mg once daily, to be taken at night    Monitor for side effects such as lightheadedness and dizziness    Avoid high potassium foods (e.g., daily consumption of bananas and coconut water)    Continue current topical regimen:     - Vichy Vitamin C in the morning     - Tretinoin 0.05% a couple times a week at night     Aurelio Brash for spot treatment    Provided additional samples of Cabtreo     Follow-up in 4 months to assess efficacy of new treatment plan  2. Post-inflammatory Hyperpigmentation  - Assessment: Patient is experiencing dark spots as a result of acne lesions. This is consistent with post-inflammatory hyperpigmentation, a common sequela of acne, particularly in patients with darker skin tones.  - Plan:    Initiate Eucerin Radiant Tone (containing thiamidol) twice daily     - Spot treat for the first month, then apply all over    Continue current sunscreen (Isntree SPF 30) for daily use    Recommended additional sunscreen options for stronger protection:     - Neutrogena mineral sunscreens SPF 50 (pink/peachy or teal blue)     - CeraVe Invisible Sunscreen Stick for direct sun exposure     - ColorScience Makeup Mix-a-Powder Sunscreen for midday touch-ups    ACNE, UNSPECIFIED ACNE TYPE   Related Medications spironolactone (ALDACTONE) 50 MG tablet Take 1 tablet (50 mg total) by mouth daily.  No follow-ups on file.    Documentation: I have reviewed the above documentation for accuracy and completeness, and I agree with the above.  I, Shirron Marcha Solders, CMA, am acting as scribe for [employer], DO.   Langston Reusing, DO

## 2024-07-14 ENCOUNTER — Ambulatory Visit: Admitting: Dermatology
